# Patient Record
Sex: Female | Born: 1985
Health system: Southern US, Community
[De-identification: ages and names within clinical notes are randomized; demographics above are authoritative.]

---

## 2017-07-29 ENCOUNTER — Ambulatory Visit: Payer: Self-pay | Admitting: Nurse Practitioner

## 2017-07-29 VITALS — BP 120/80 | HR 74 | Temp 97.7°F | Resp 16 | Wt 245.0 lb

## 2017-07-29 DIAGNOSIS — H6980 Other specified disorders of Eustachian tube, unspecified ear: Secondary | ICD-10-CM

## 2017-07-29 DIAGNOSIS — J029 Acute pharyngitis, unspecified: Secondary | ICD-10-CM

## 2017-07-29 NOTE — Progress Notes (Signed)
Patient ID: Kaitlyn Combs, female   DOB: 10/26/1985, 31 y.o.   MRN: [id number]  Kaitlyn Combs is a 31 y.o. female who presents for evaluation of sore throat. Associated symptoms include sore throat and ear pain, bilateral. Onset of symptoms was 3 days ago, and have been gradually worsening since that time. She is drinking plenty of fluids. She has not had a recent close exposure to someone with proven streptococcal pharyngitis.  The following portions of the patient's history were reviewed and updated as appropriate: allergies, current medications and past medical history.  Review of Systems Constitutional: negative Eyes: negative Ears, nose, mouth, throat, and face: positive for earaches, sore throat and post-nasal drip, negative for nasal congestion Respiratory: negative Cardiovascular: negative Neurological: negative Allergic/Immunologic: positive for hay fever    Objective:    BP 120/80 (BP Location: Right Arm, Patient Position: Sitting, Cuff Size: Normal)   Pulse 74   Temp 97.7 F (36.5 C) (Oral)   Resp 16   Wt 245 lb 3.2 oz (111.2 kg)   SpO2 98%  General appearance: alert, cooperative, mild distress and due to throat pain Head: Normocephalic, without obvious abnormality, atraumatic Eyes: conjunctivae/corneas clear. PERRL, EOM's intact. Fundi benign. Ears: abnormal TM right ear - moderate effusion and abnormal TM left ear - moderate effusion Nose: Nares normal. Septum midline. Mucosa normal. No drainage or sinus tenderness. Throat: abnormal findings: moderate oropharyngeal erythema and no exudate Lungs: clear to auscultation bilaterally Heart: regular rate and rhythm, S1, S2 normal, no murmur, click, rub or gallop Abdomen: soft, non-tender; bowel sounds normal; no masses,  no organomegaly Skin: Skin color, texture, turgor normal. No rashes or lesions Lymph nodes: Cervical adenopathy: bilaterally  Laboratory Strep test not done. Results:not applicable.    Assessment:    Acute pharyngitis, likely  Viral pharyngitis.    Plan:    Use of OTC analgesics recommended as well as salt water gargles. Patient will start Amoxicillin if no improvement in 3-4 days.  Patient will use Benadry at night for Eustachian Tube Dysfunction.  Ibuprofen or Tylenol for pain, fever, or general discomfort.  Patient to also use honey or saltwater gargles or continue OTC medications during the day.  May use Magic Mouthwash for severe throat pain. Follow up as needed.  Patient verbalizes understanding.

## 2017-07-29 NOTE — Patient Instructions (Addendum)
Pharyngitis Pharyngitis is redness, pain, and swelling (inflammation) of your pharynx. What are the causes? Pharyngitis is usually caused by infection. Most of the time, these infections are from viruses (viral) and are part of a cold. However, sometimes pharyngitis is caused by bacteria (bacterial). Pharyngitis can also be caused by allergies. Viral pharyngitis may be spread from person to person by coughing, sneezing, and personal items or utensils (cups, forks, spoons, toothbrushes). Bacterial pharyngitis may be spread from person to person by more intimate contact, such as kissing. What are the signs or symptoms? Symptoms of pharyngitis include:  Sore throat.  Tiredness (fatigue).  Low-grade fever.  Headache.  Joint pain and muscle aches.  Skin rashes.  Swollen lymph nodes.  Plaque-like film on throat or tonsils (often seen with bacterial pharyngitis).  How is this diagnosed? Your health care provider will ask you questions about your illness and your symptoms. Your medical history, along with a physical exam, is often all that is needed to diagnose pharyngitis. Sometimes, a rapid strep test is done. Other lab tests may also be done, depending on the suspected cause. How is this treated? Viral pharyngitis will usually get better in 3-4 days without the use of medicine. Bacterial pharyngitis is treated with medicines that kill germs (antibiotics). Follow these instructions at home:  Drink enough water and fluids to keep your urine clear or pale yellow.  Only take over-the-counter or prescription medicines as directed by your health care provider: ? If you are prescribed antibiotics, make sure you finish them even if you start to feel better. ? Do not take aspirin.  Get lots of rest.  Gargle with 8 oz of salt water ( tsp of salt per 1 qt of water) as often as every 1-2 hours to soothe your throat.  Throat lozenges (if you are not at risk for choking) or sprays may be used to  soothe your throat.  If symptoms do not improve in 3-4 days, start Amoxicillin.   May use honey for sore throat.  Magic Mouthwash for severe throat pain.  Ibuprofen or Tylenol for pain, fever or general discomfort.  Contact a health care provider if:  You have large, tender lumps in your neck.  You have a rash.  You cough up green, yellow-brown, or bloody spit. Get help right away if:  Your neck becomes stiff.  You drool or are unable to swallow liquids.  You vomit or are unable to keep medicines or liquids down.  You have severe pain that does not go away with the use of recommended medicines.  You have trouble breathing (not caused by a stuffy nose). This information is not intended to replace advice given to you by your health care provider. Make sure you discuss any questions you have with your health care provider. Document Released: 07/30/2005 Document Revised: 01/05/2016 Document Reviewed: 04/06/2013 Elsevier Interactive Patient Education  2017 [employer]  Eustachian Tube Dysfunction The eustachian tube connects the middle ear to the back of the nose. It regulates air pressure in the middle ear by allowing air to move between the ear and nose. It also helps to drain fluid from the middle ear space. When the eustachian tube does not function properly, air pressure, fluid, or both can build up in the middle ear. Eustachian tube dysfunction can affect one or both ears. What are the causes? This condition happens when the eustachian tube becomes blocked or cannot open normally. This may result from:  Ear infections.  Colds and other  upper respiratory infections.  Allergies.  Irritation, such as from cigarette smoke or acid from the stomach coming up into the esophagus (gastroesophageal reflux).  Sudden changes in air pressure, such as from descending in an airplane.  Abnormal growths in the nose or throat, such as nasal polyps, tumors, or enlarged tissue at the  back of the throat (adenoids).  What increases the risk? This condition may be more likely to develop in people who smoke and people who are overweight. Eustachian tube dysfunction may also be more likely to develop in children, especially children who have:  Certain birth defects of the mouth, such as cleft palate.  Large tonsils and adenoids.  What are the signs or symptoms? Symptoms of this condition may include:  A feeling of fullness in the ear.  Ear pain.  Clicking or popping noises in the ear.  Ringing in the ear.  Hearing loss.  Loss of balance.  Symptoms may get worse when the air pressure around you changes, such as when you travel to an area of high elevation or fly on an airplane. How is this diagnosed? This condition may be diagnosed based on:  Your symptoms.  A physical exam of your ear, nose, and throat.  Tests, such as those that measure: ? The movement of your eardrum (tympanogram). ? Your hearing (audiometry).  How is this treated? Treatment depends on the cause and severity of your condition. If your symptoms are mild, you may be able to relieve your symptoms by moving air into ("popping") your ears. If you have symptoms of fluid in your ears, treatment may include:  Decongestants.  Antihistamines.  Nasal sprays or ear drops that contain medicines that reduce swelling (steroids).  In some cases, you may need to have a procedure to drain the fluid in your eardrum (myringotomy). In this procedure, a small tube is placed in the eardrum to:  Drain the fluid.  Restore the air in the middle ear space.  Follow these instructions at home:  Take over-the-counter and prescription medicines only as told by your health care provider.  Use techniques to help pop your ears as recommended by your health care provider. These may include: ? Chewing gum. ? Yawning. ? Frequent, forceful swallowing. ? Closing your mouth, holding your nose closed, and gently  blowing as if you are trying to blow air out of your nose.  Do not do any of the following until your health care provider approves: ? Travel to high altitudes. ? Fly in airplanes. ? Work in a Estate agentpressurized cabin or room. ? Scuba dive.  Keep your ears dry. Dry your ears completely after showering or bathing.  Do not smoke.  Keep all follow-up visits as told by your health care provider. This is important.  Take Benadryl at night until symptoms improve. Contact a health care provider if:  Your symptoms do not go away after treatment.  Your symptoms come back after treatment.  You are unable to pop your ears.  You have: ? A fever. ? Pain in your ear. ? Pain in your head or neck. ? Fluid draining from your ear.  Your hearing suddenly changes.  You become very dizzy.  You lose your balance. This information is not intended to replace advice given to you by your health care provider. Make sure you discuss any questions you have with your health care provider. Document Released: 08/26/2015 Document Revised: 01/05/2016 Document Reviewed: 08/18/2014 Elsevier Interactive Patient Education  [employer]2018 [employer] Inc.

## 2020-09-28 ENCOUNTER — Ambulatory Visit: Payer: BC Managed Care – PPO | Admitting: Family Medicine

## 2020-09-28 ENCOUNTER — Other Ambulatory Visit: Payer: Self-pay

## 2020-09-28 ENCOUNTER — Encounter: Payer: Self-pay | Admitting: Family Medicine

## 2020-09-28 VITALS — BP 114/78 | Ht 64.5 in | Wt 190.0 lb

## 2020-09-28 DIAGNOSIS — M7741 Metatarsalgia, right foot: Secondary | ICD-10-CM | POA: Diagnosis not present

## 2020-09-28 DIAGNOSIS — S83241A Other tear of medial meniscus, current injury, right knee, initial encounter: Secondary | ICD-10-CM

## 2020-09-28 NOTE — Progress Notes (Addendum)
PCP: No primary care provider on file.  Subjective:   HPI: Patient is a 35 y.o. female here for evaluation for custom orthotics.  She was recently seen by Christena Deem, sports medicine physician at [facility], due to concern for sesamoid fracture.  Prior to seeing Dr. Lyn Hollingshead, she was evaluated for plantar foot pain, and x-rays were obtained which showed a bipartite sesamoid bone on her right foot.  There is concern that this was a fracture and therefore she was seen by Dr. Lyn Hollingshead.  Prior to that visit, she was doing a ruck with a heavy backpack and developed medial knee pain.  There was concern for medial meniscus injury and it was agreed to manage it conservatively for now.  He then sent her here for further evaluation of foot pain and consideration of custom orthoses.  Today, patient states that she does continue to have pain on the plantar aspect of her foot just lateral to her first metatarsal head, somewhat between the first and second metatarsal heads.  She has not noticed any numbness or tingling, is not worse with narrow shoes as far she is aware.  Is been progressive over time.  Her knee is actually more painful than her foot at this point in time.  No past medical history on file.  Current Outpatient Medications on File Prior to Visit  Medication Sig Dispense Refill  . Pseudoeph-Doxylamine-DM-APAP (NYQUIL PO) Take by mouth.     No current facility-administered medications on file prior to visit.    History reviewed. No pertinent surgical history.  No Known Allergies  Social History   Socioeconomic History  . Marital status: Unknown    Spouse name: Not on file  . Number of children: Not on file  . Years of education: Not on file  . Highest education level: Not on file  Occupational History  . Not on file  Tobacco Use  . Smoking status: Not on file  . Smokeless tobacco: Not on file  Substance and Sexual Activity  . Alcohol use: Not on file  . Drug use: Not on file  .  Sexual activity: Not on file  Other Topics Concern  . Not on file  Social History Narrative  . Not on file   Social Determinants of Health   Financial Resource Strain: Not on file  Food Insecurity: Not on file  Transportation Needs: Not on file  Physical Activity: Not on file  Stress: Not on file  Social Connections: Not on file  Intimate Partner Violence: Not on file    No family history on file.  BP 114/78   Ht 5' 4.5" (1.638 m)   Wt 190 lb (86.2 kg)   BMI 32.11 kg/m   Review of Systems: See HPI above.     Objective:  Physical Exam:  Gen: NAD, comfortable in exam room  Right foot: -Inspection: A small amount of callus formation over the second metatarsal head.   -Gait: On analysis of her standing gait, she has fairly preserved longitudinal arch but collapse of the transverse arch with splaying of the first and second toes bilaterally.  With walking, she has fairly significant knee valgus without significant collapse of the longitudinal arch. -Palpation: She does have mild tenderness to palpation between the first and second metatarsals and somewhat over the second metatarsal.  Minimally tender over the sesamoid bone. -ROM: Full range of motion of the foot and ankle -Strength: Full strength of the foot and ankle -Neurovascular intact distally  Right knee  Inspection: Bilateral knees without evidence of erythema, ecchymosis, swelling, edema. No effusion present  Active ROM: Intact. 0-160d. Passive ROM: 3d passive hyperextension  Strength: 5/5 strength to resisted flexion/extension without pain  Patella: Negative patellar grind. No patellar facet tenderness. No apprehension. No proximal or distal patellar tendon tenderness to palpation. No quad tendon tenderness to palpation.  Tibia: No tibial plateau, tibial tuberosity tenderness.   Joint line: Medial joint line tenderness, no tenderness laterally Popliteal: No popliteal tenderness to the insertional gastroc. No  insertional biceps femoris, semimembranosis, semitendinosis tenderness.  McMurrays/Thessaly's: Positive in the medial joint line of the right knee  Lachmans: Stable bilaterally with firm endpoint  Anterior/Posterior drawer: Stable bilaterally Varus/valgus stress at 0, 15d: Negative for pain, laxity    Assessment & Plan:  1.  Metatarsalgia of right foot  2.  Genu valgus 3.  Right medial meniscus tear  Patient with collapse of the transverse arch and metatarsal pain mostly over the second metatarsal on her right foot.  Suspect metatarsalgia to be the main cause of her pain due to collapse of the transverse arch rather than the bipartite sesamoid.  Is also possible that the bipartite sesamoid has some increased irritation due to transverse arch collapse.  She also does have significant genu valgus which may be putting excess pressure on her medial forefoot.  To this end, she is fitted with a green insole with metatarsal pad and scaphoid pad.  She did report improvement in her foot pain with this.  We will plan to follow-up in 2 to 3 weeks after she has been wearing them in her shoes to reevaluate and consider custom orthoses.  In terms of her right knee injury, this is already being evaluated by Dr. Lyn Hollingshead and he also is concerned about meniscus tear.  Encouraged her to follow-up with him on this.  Guy Sandifer, MD Sports Medicine Fellow, [place]  Addendum:  I was the preceptor for this visit and available for immediate consultation.  Norton Blizzard MD Marrianne Mood

## 2020-09-30 ENCOUNTER — Other Ambulatory Visit: Payer: Self-pay | Admitting: Sports Medicine

## 2020-09-30 DIAGNOSIS — M25561 Pain in right knee: Secondary | ICD-10-CM

## 2020-10-05 ENCOUNTER — Ambulatory Visit
Admission: RE | Admit: 2020-10-05 | Discharge: 2020-10-05 | Disposition: A | Discharge status: Home / Self Care | Payer: BC Managed Care – PPO | Source: Ambulatory Visit | Attending: Sports Medicine | Admitting: Sports Medicine

## 2020-10-05 ENCOUNTER — Other Ambulatory Visit: Payer: Self-pay

## 2020-10-05 DIAGNOSIS — M25561 Pain in right knee: Secondary | ICD-10-CM

## 2020-12-14 ENCOUNTER — Other Ambulatory Visit: Payer: Self-pay | Admitting: Orthopedic Surgery

## 2020-12-14 DIAGNOSIS — M79671 Pain in right foot: Secondary | ICD-10-CM

## 2020-12-17 ENCOUNTER — Ambulatory Visit
Admission: RE | Admit: 2020-12-17 | Discharge: 2020-12-17 | Disposition: A | Discharge status: Home / Self Care | Payer: BC Managed Care – PPO | Source: Ambulatory Visit | Attending: Orthopedic Surgery | Admitting: Orthopedic Surgery

## 2020-12-17 ENCOUNTER — Other Ambulatory Visit: Payer: Self-pay

## 2020-12-17 DIAGNOSIS — M79671 Pain in right foot: Secondary | ICD-10-CM

## 2022-03-23 IMAGING — MR MR FOOT*R* W/O CM
4 of 5 series · 17 of 40 positions shown · non-contrast
Comparison: Right toe radiographs 09/13/2020

CLINICAL DATA: Plantar forefoot pain for 1 year. No known injury.
No previous relevant surgery.

EXAM:
MRI OF THE RIGHT FOREFOOT WITHOUT CONTRAST
TECHNIQUE: Multiplanar, multisequence MR imaging of the right forefoot was
performed. No intravenous contrast was administered.

[Series 4: T1 · coronal · 3.0mm · 0.19mm/px · 3 of 44 slices shown (1 of 2)]
[im 8/44]
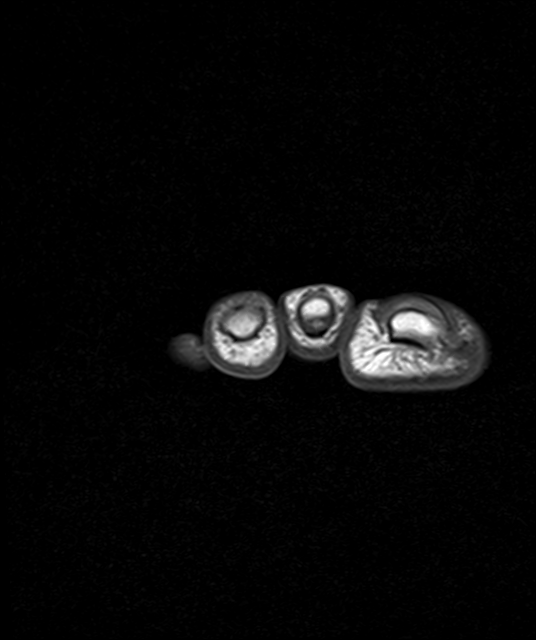
[im 24/44]
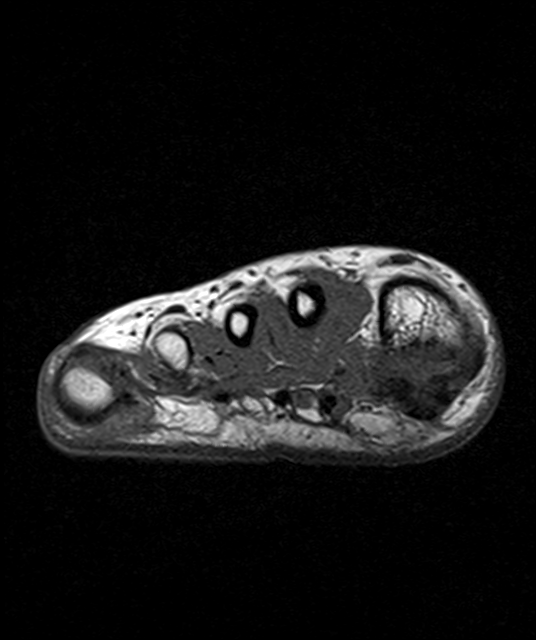
[im 40/44]
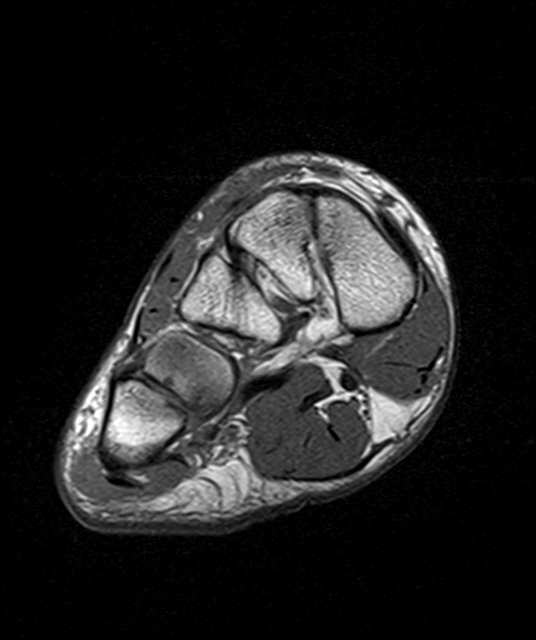

[Series 5: T2 fat-sat · coronal · 3.0mm · 0.19mm/px · 8 of 44 slices shown (1 of 2)]
[im 1/44]
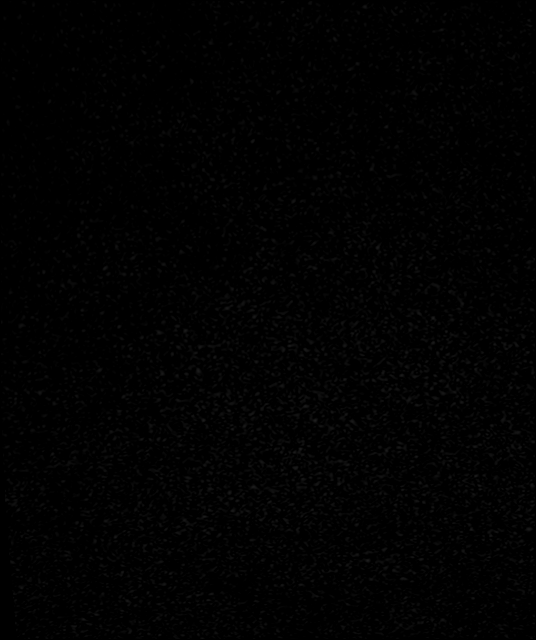
[im 5/44]
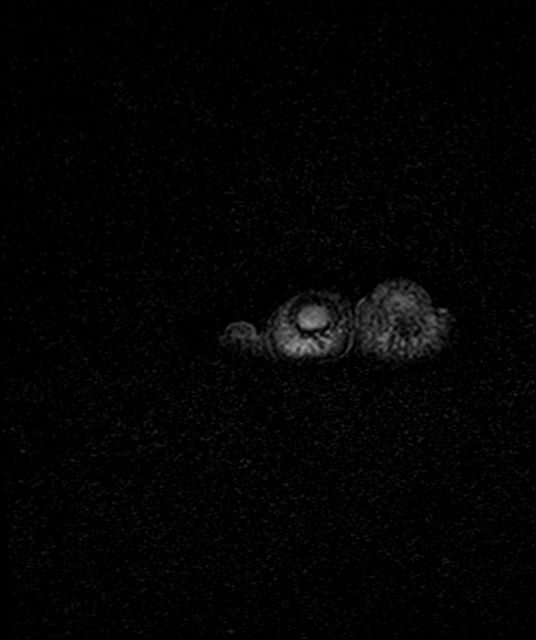
[im 9/44]
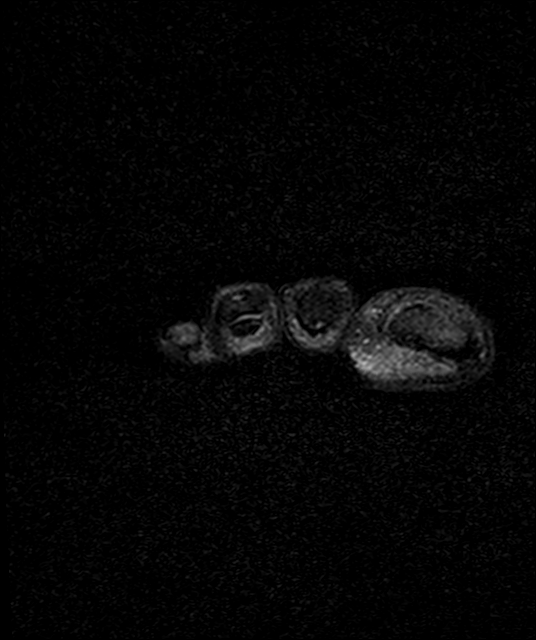
[im 13/44]
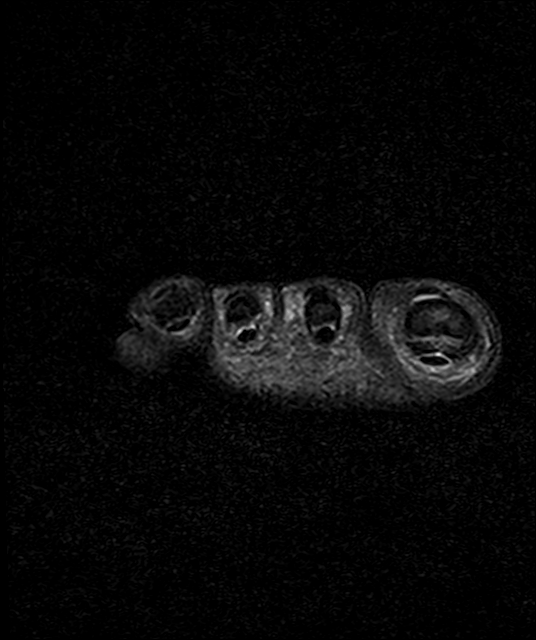
[im 18/44]
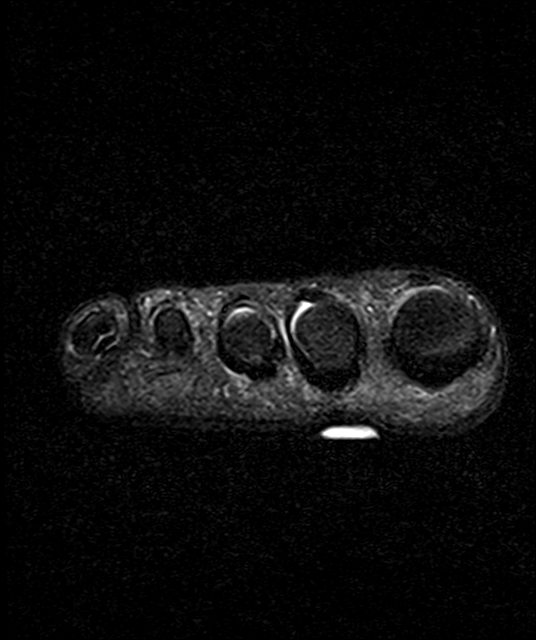
[im 22/44]
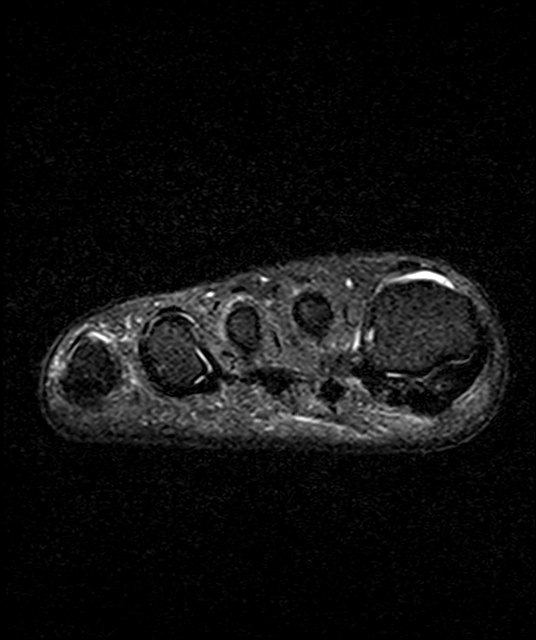
[im 26/44]
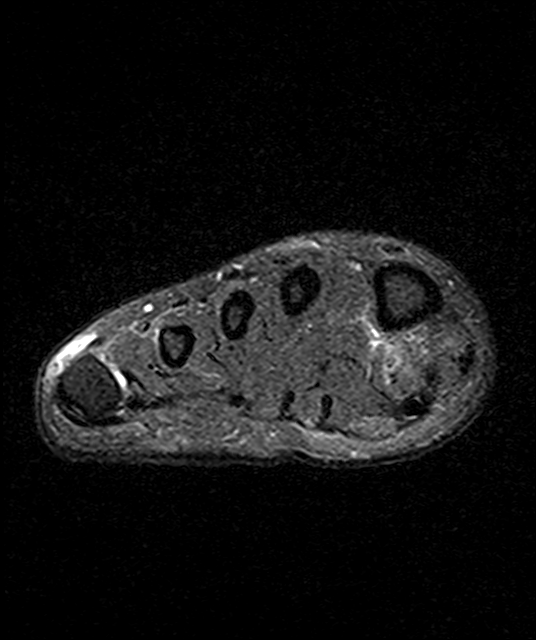
[im 39/44]
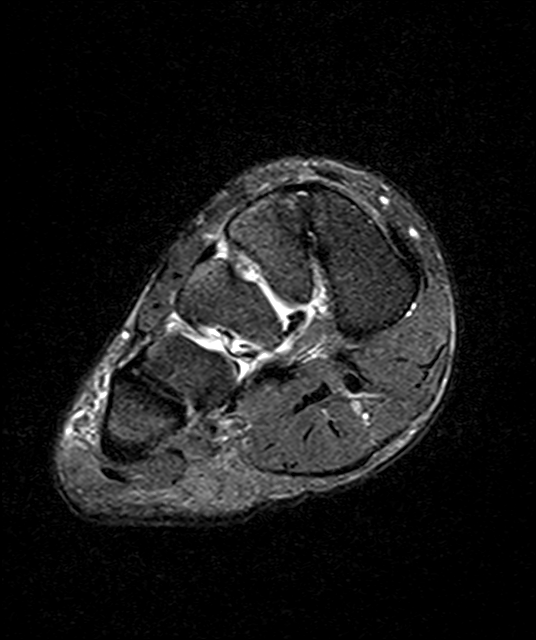

[Series 6: T2 fat-sat · axial · 3.0mm · 0.35mm/px · z∈[-110,-29]mm · 3 of 22 slices shown (2 of 2)]
[im 1/22]
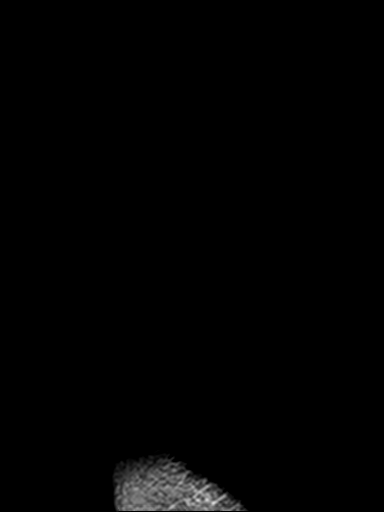
[im 11/22]
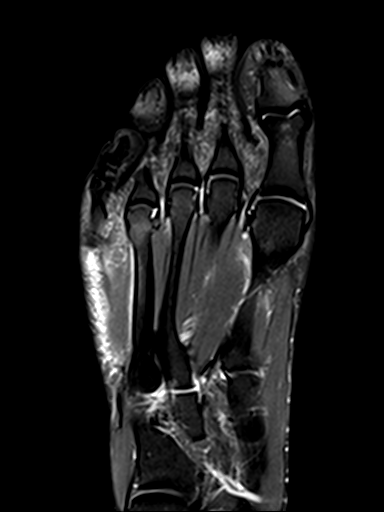
[im 22/22]
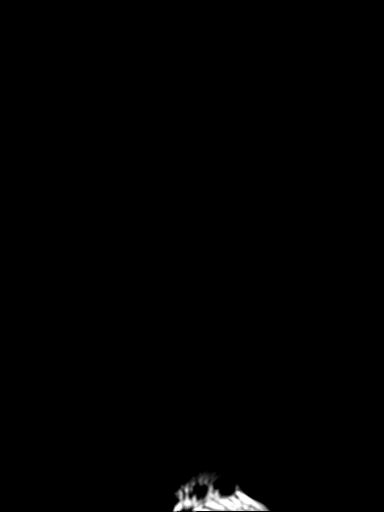

[Series 7: T1 · axial · 3.0mm · 0.35mm/px · z∈[-110,-29]mm · 3 of 22 slices shown (2 of 2)]
[im 1/22]
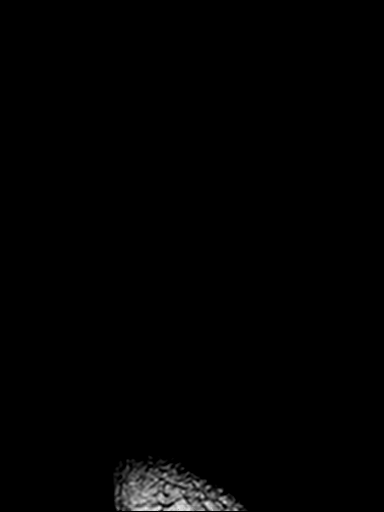
[im 11/22]
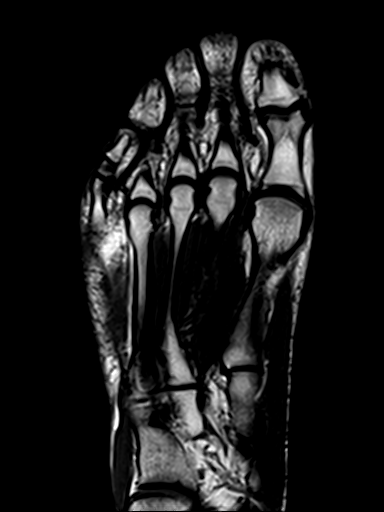
[im 22/22]
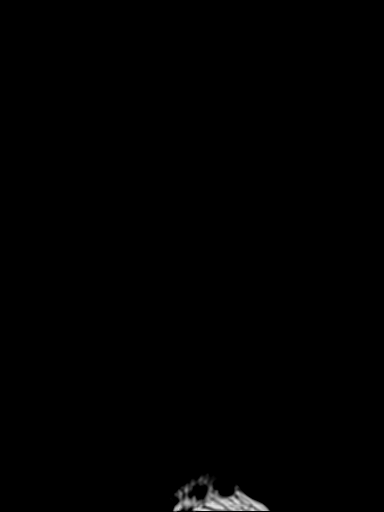

[17 of 40 positions shown; findings below may reference images not displayed]

FINDINGS: Bones/Joint/Cartilage

No evidence of acute fracture or dislocation. There is a small
effusion of the 1st metatarsophalangeal joint. The fibular sesamoid
of the 1st metatarsal is bipartite and sclerotic with low T1 signal.
No T2 hyperintense osseous lesions are identified.

Ligaments

The Lisfranc ligament is intact. The collateral ligaments of the
metatarsophalangeal joints appear intact. No plantar plate
disruption identified.

Muscles and Tendons

A capsule was placed over the area of pain, plantar to the 2nd
metatarsal head. The flexor tendons appear normal without
tenosynovitis. There is mild nonspecific edema distally within the
flexor hallucis brevis muscle. No focal muscular atrophy. The
extensor tendons appear normal.

Soft tissues

Probable incidental pressure lesion plantar to the 5th metatarsal
head. There is no focal subcutaneous abnormality in the 1st or 2nd
web spaces. No evidence of Morton's neuroma.
IMPRESSION: 1. Mild nonspecific edema within the flexor hallucis brevis muscle
without evidence of plantar plate disruption.
2. Small effusion of the 1st metatarsophalangeal joint with
underlying bipartite fibular sesamoid. No acute osseous findings.
3. No acute osseous findings.
